# Patient Record
Sex: Female | Born: 1985 | Race: Black or African American | Hispanic: No | Marital: Single | Smoking: Current every day smoker
Health system: Southern US, Community
[De-identification: ages and names within clinical notes are randomized; demographics above are authoritative.]

## PROBLEM LIST (undated history)

## (undated) HISTORY — PX: NO PAST SURGERIES: SHX2092

---

## 2006-06-23 ENCOUNTER — Inpatient Hospital Stay (HOSPITAL_COMMUNITY): Admission: RE | Admit: 2006-06-23 | Discharge: 2006-06-25 | Payer: Self-pay | Admitting: Obstetrics

## 2010-04-13 ENCOUNTER — Emergency Department (HOSPITAL_COMMUNITY): Admission: EM | Admit: 2010-04-13 | Discharge: 2010-04-13 | Payer: Self-pay | Admitting: Emergency Medicine

## 2010-09-28 NOTE — L&D Delivery Note (Signed)
Delivery Note At 4:57 PM a viable female was delivered via Vaginal, Spontaneous Delivery (Presentation: Left Occiput Anterior).  APGAR: , ; weight .   Placenta status: , Spontaneous.  Cord: 3 vessels with the following complications: None.  Cord pH: not done Anesthesia: Epidural  Episiotomy: None Lacerations: None Suture Repair: 2.0 Est. Blood Loss (mL): 300  Mom to postpartum.  Baby to nursery-stable.  Ronit Marczak A 05/01/2011, 5:11 PM

## 2010-12-10 ENCOUNTER — Other Ambulatory Visit (HOSPITAL_COMMUNITY): Payer: Self-pay | Admitting: Obstetrics

## 2010-12-10 DIAGNOSIS — Z3689 Encounter for other specified antenatal screening: Secondary | ICD-10-CM

## 2010-12-11 ENCOUNTER — Ambulatory Visit (HOSPITAL_COMMUNITY)
Admission: RE | Admit: 2010-12-11 | Discharge: 2010-12-11 | Disposition: A | Discharge status: Home / Self Care | Payer: Medicaid Other | Source: Ambulatory Visit | Attending: Obstetrics | Admitting: Obstetrics

## 2010-12-11 DIAGNOSIS — Z1389 Encounter for screening for other disorder: Secondary | ICD-10-CM | POA: Insufficient documentation

## 2010-12-11 DIAGNOSIS — Z363 Encounter for antenatal screening for malformations: Secondary | ICD-10-CM | POA: Insufficient documentation

## 2010-12-11 DIAGNOSIS — O358XX0 Maternal care for other (suspected) fetal abnormality and damage, not applicable or unspecified: Secondary | ICD-10-CM | POA: Insufficient documentation

## 2010-12-11 DIAGNOSIS — Z3689 Encounter for other specified antenatal screening: Secondary | ICD-10-CM

## 2010-12-16 ENCOUNTER — Other Ambulatory Visit (HOSPITAL_COMMUNITY): Payer: Self-pay | Admitting: Obstetrics

## 2010-12-16 DIAGNOSIS — Z3689 Encounter for other specified antenatal screening: Secondary | ICD-10-CM

## 2011-01-01 ENCOUNTER — Ambulatory Visit (HOSPITAL_COMMUNITY): Payer: Medicaid Other

## 2011-01-02 ENCOUNTER — Ambulatory Visit (HOSPITAL_COMMUNITY)
Admission: RE | Admit: 2011-01-02 | Discharge: 2011-01-02 | Disposition: A | Discharge status: Home / Self Care | Payer: Medicaid Other | Source: Ambulatory Visit | Attending: Obstetrics | Admitting: Obstetrics

## 2011-01-02 ENCOUNTER — Encounter (HOSPITAL_COMMUNITY): Payer: Self-pay

## 2011-01-02 DIAGNOSIS — Z3689 Encounter for other specified antenatal screening: Secondary | ICD-10-CM | POA: Insufficient documentation

## 2011-01-02 DIAGNOSIS — O26849 Uterine size-date discrepancy, unspecified trimester: Secondary | ICD-10-CM | POA: Insufficient documentation

## 2011-04-30 ENCOUNTER — Inpatient Hospital Stay (HOSPITAL_COMMUNITY): Admission: RE | Admit: 2011-04-30 | Payer: Medicaid Other | Source: Ambulatory Visit

## 2011-05-01 ENCOUNTER — Inpatient Hospital Stay (HOSPITAL_COMMUNITY): Payer: 59 | Admitting: Anesthesiology

## 2011-05-01 ENCOUNTER — Encounter (HOSPITAL_COMMUNITY): Payer: Self-pay | Admitting: Obstetrics

## 2011-05-01 ENCOUNTER — Inpatient Hospital Stay (HOSPITAL_COMMUNITY)
Admission: AD | Admit: 2011-05-01 | Discharge: 2011-05-03 | DRG: 775 | Disposition: A | Discharge status: Home / Self Care | Payer: 59 | Source: Ambulatory Visit | Attending: Obstetrics | Admitting: Obstetrics

## 2011-05-01 ENCOUNTER — Encounter (HOSPITAL_COMMUNITY): Payer: Self-pay

## 2011-05-01 ENCOUNTER — Encounter (HOSPITAL_COMMUNITY): Payer: Self-pay | Admitting: Anesthesiology

## 2011-05-01 LAB — RPR
RPR Ser Ql: NONREACTIVE
RPR: NONREACTIVE

## 2011-05-01 LAB — ABO/RH: RH Type: POSITIVE

## 2011-05-01 LAB — CBC
HCT: 37.1 % (ref 36.0–46.0)
Hemoglobin: 12 g/dL (ref 12.0–15.0)
RDW: 15.3 % (ref 11.5–15.5)
WBC: 9 10*3/uL (ref 4.0–10.5)

## 2011-05-01 LAB — STREP B DNA PROBE: GBS: NEGATIVE

## 2011-05-01 LAB — HIV ANTIBODY (ROUTINE TESTING W REFLEX): HIV: NONREACTIVE

## 2011-05-01 LAB — RUBELLA ANTIBODY, IGM: Rubella: IMMUNE

## 2011-05-01 MED ORDER — OXYCODONE-ACETAMINOPHEN 5-325 MG PO TABS: 1.0000 | ORAL_TABLET | ORAL | Status: DC | PRN

## 2011-05-01 MED ORDER — EPHEDRINE 5 MG/ML INJ: 10.0000 mg | INTRAVENOUS | Status: DC | PRN

## 2011-05-01 MED ORDER — ZOLPIDEM TARTRATE 5 MG PO TABS: 5.0000 mg | ORAL_TABLET | Freq: Every evening | ORAL | Status: DC | PRN

## 2011-05-01 MED ORDER — OXYCODONE-ACETAMINOPHEN 5-325 MG PO TABS: 2.0000 | ORAL_TABLET | ORAL | Status: DC | PRN

## 2011-05-01 MED ORDER — PRENATAL PLUS 27-1 MG PO TABS
1.0000 | ORAL_TABLET | Freq: Every day | ORAL | Status: DC
  Administered 2011-05-02 – 2011-05-03 (×2): 1 via ORAL
  Filled 2011-05-01 (×2): qty 1

## 2011-05-01 MED ORDER — SENNOSIDES-DOCUSATE SODIUM 8.6-50 MG PO TABS
2.0000 | ORAL_TABLET | Freq: Every day | ORAL | Status: DC
  Administered 2011-05-01: 1 via ORAL
  Administered 2011-05-02: 2 via ORAL

## 2011-05-01 MED ORDER — FLEET ENEMA 7-19 GM/118ML RE ENEM: 1.0000 | ENEMA | RECTAL | Status: DC | PRN

## 2011-05-01 MED ORDER — SODIUM CHLORIDE 0.9 % IJ SOLN: 3.0000 mL | Freq: Two times a day (BID) | INTRAMUSCULAR | Status: DC

## 2011-05-01 MED ORDER — EPHEDRINE 5 MG/ML INJ
10.0000 mg | INTRAVENOUS | Status: DC | PRN
  Filled 2011-05-01: qty 4

## 2011-05-01 MED ORDER — ONDANSETRON HCL 4 MG/2ML IJ SOLN: 4.0000 mg | Freq: Four times a day (QID) | INTRAMUSCULAR | Status: DC | PRN

## 2011-05-01 MED ORDER — FENTANYL 2.5 MCG/ML BUPIVACAINE 1/10 % EPIDURAL INFUSION (WH - ANES)
14.0000 mL/h | INTRAMUSCULAR | Status: DC
  Administered 2011-05-01: 14 mL/h via EPIDURAL
  Filled 2011-05-01: qty 60

## 2011-05-01 MED ORDER — SIMETHICONE 80 MG PO CHEW: 80.0000 mg | CHEWABLE_TABLET | ORAL | Status: DC | PRN

## 2011-05-01 MED ORDER — LACTATED RINGERS IV SOLN
500.0000 mL | Freq: Once | INTRAVENOUS | Status: AC
  Administered 2011-05-01: 500 mL via INTRAVENOUS

## 2011-05-01 MED ORDER — OXYTOCIN 20 UNITS IN LACTATED RINGERS INFUSION - SIMPLE
125.0000 mL/h | Freq: Once | INTRAVENOUS | Status: AC
  Administered 2011-05-01: 125 mL/h via INTRAVENOUS
  Filled 2011-05-01: qty 1000

## 2011-05-01 MED ORDER — CITRIC ACID-SODIUM CITRATE 334-500 MG/5ML PO SOLN
30.0000 mL | ORAL | Status: DC | PRN
  Administered 2011-05-01: 30 mL via ORAL
  Filled 2011-05-01: qty 15

## 2011-05-01 MED ORDER — LACTATED RINGERS IV SOLN: 500.0000 mL | INTRAVENOUS | Status: DC | PRN

## 2011-05-01 MED ORDER — ONDANSETRON HCL 4 MG/2ML IJ SOLN: 4.0000 mg | INTRAMUSCULAR | Status: DC | PRN

## 2011-05-01 MED ORDER — PHENYLEPHRINE 40 MCG/ML (10ML) SYRINGE FOR IV PUSH (FOR BLOOD PRESSURE SUPPORT): 80.0000 ug | PREFILLED_SYRINGE | INTRAVENOUS | Status: DC | PRN

## 2011-05-01 MED ORDER — DIPHENHYDRAMINE HCL 50 MG/ML IJ SOLN: 12.5000 mg | INTRAMUSCULAR | Status: DC | PRN

## 2011-05-01 MED ORDER — ACETAMINOPHEN 325 MG PO TABS: 650.0000 mg | ORAL_TABLET | ORAL | Status: DC | PRN

## 2011-05-01 MED ORDER — SODIUM CHLORIDE 0.9 % IV SOLN: 250.0000 mL | INTRAVENOUS | Status: DC

## 2011-05-01 MED ORDER — NALBUPHINE SYRINGE 5 MG/0.5 ML: 10.0000 mg | INJECTION | INTRAMUSCULAR | Status: DC | PRN

## 2011-05-01 MED ORDER — OXYTOCIN 20 UNITS IN LACTATED RINGERS INFUSION - SIMPLE: 125.0000 mL/h | INTRAVENOUS | Status: DC | PRN

## 2011-05-01 MED ORDER — LACTATED RINGERS IV SOLN
INTRAVENOUS | Status: DC
  Administered 2011-05-01: 125 mL/h via INTRAVENOUS

## 2011-05-01 MED ORDER — ONDANSETRON HCL 4 MG PO TABS: 4.0000 mg | ORAL_TABLET | ORAL | Status: DC | PRN

## 2011-05-01 MED ORDER — PHENYLEPHRINE 40 MCG/ML (10ML) SYRINGE FOR IV PUSH (FOR BLOOD PRESSURE SUPPORT)
80.0000 ug | PREFILLED_SYRINGE | INTRAVENOUS | Status: DC | PRN
  Filled 2011-05-01: qty 5

## 2011-05-01 MED ORDER — DIPHENHYDRAMINE HCL 25 MG PO CAPS: 25.0000 mg | ORAL_CAPSULE | Freq: Four times a day (QID) | ORAL | Status: DC | PRN

## 2011-05-01 MED ORDER — IBUPROFEN 600 MG PO TABS
600.0000 mg | ORAL_TABLET | Freq: Four times a day (QID) | ORAL | Status: DC
  Administered 2011-05-02 – 2011-05-03 (×6): 600 mg via ORAL
  Filled 2011-05-01 (×5): qty 1

## 2011-05-01 MED ORDER — SODIUM CHLORIDE 0.9 % IJ SOLN: 3.0000 mL | INTRAMUSCULAR | Status: DC | PRN

## 2011-05-01 MED ORDER — TETANUS-DIPHTH-ACELL PERTUSSIS 5-2.5-18.5 LF-MCG/0.5 IM SUSP
0.5000 mL | Freq: Once | INTRAMUSCULAR | Status: AC
  Administered 2011-05-02: 0.5 mL via INTRAMUSCULAR
  Filled 2011-05-01: qty 0.5

## 2011-05-01 MED ORDER — LIDOCAINE HCL (PF) 1 % IJ SOLN
30.0000 mL | INTRAMUSCULAR | Status: DC | PRN
  Filled 2011-05-01: qty 30

## 2011-05-01 MED ORDER — BENZOCAINE-MENTHOL 20-0.5 % EX AERO: 1.0000 "application " | INHALATION_SPRAY | CUTANEOUS | Status: DC | PRN

## 2011-05-01 MED ORDER — LIDOCAINE HCL 1.5 % IJ SOLN
INTRAMUSCULAR | Status: DC | PRN
  Administered 2011-05-01 (×2): 4 mL

## 2011-05-01 MED ORDER — FERROUS SULFATE 325 (65 FE) MG PO TABS
325.0000 mg | ORAL_TABLET | Freq: Two times a day (BID) | ORAL | Status: DC
  Administered 2011-05-02 (×2): 325 mg via ORAL
  Filled 2011-05-01 (×2): qty 1

## 2011-05-01 MED ORDER — IBUPROFEN 600 MG PO TABS
600.0000 mg | ORAL_TABLET | Freq: Four times a day (QID) | ORAL | Status: DC | PRN
  Administered 2011-05-01: 600 mg via ORAL
  Filled 2011-05-01 (×2): qty 1

## 2011-05-01 NOTE — H&P (Signed)
This is Dr. Francoise Ceo dictating the admitting note on Nicole Wall The patient's a 25 year old gravida 3 para 1011 EDC 05/01/2011 No known allergies which is B+ she was admitted in labor cervix 6 cm dilated bulging membranes vertex 0 station Negative GBS membranes were ruptured artificially the fluid was clear and shows a fully dilated at 0 station Past medical history negative Social history negative Family history negative System review negative Physical HEENT negative Breasts negative Heart regular rhythm no murmurs no gallops Lungs clear Abdomen term Pelvic as described above Extremities negative

## 2011-05-01 NOTE — Anesthesia Procedure Notes (Signed)
Epidural Patient location during procedure: OB Start time: 05/01/2011 4:19 PM  Staffing Anesthesiologist: Mahmud Keithly A. Performed by: anesthesiologist   Preanesthetic Checklist Completed: patient identified, site marked, surgical consent, pre-op evaluation, timeout performed, IV checked, risks and benefits discussed and monitors and equipment checked  Epidural Patient position: sitting Prep: site prepped and draped and DuraPrep Patient monitoring: continuous pulse ox and blood pressure Approach: midline Injection technique: LOR air  Needle:  Needle type: Tuohy  Needle gauge: 17 G Needle length: 9 cm Needle insertion depth: 5 cm cm Catheter type: closed end flexible Catheter size: 19 Gauge Catheter at skin depth: 10 cm Test dose: negative and 1.5% lidocaine  Assessment Events: blood not aspirated, injection not painful, no injection resistance, negative IV test and no paresthesia  Additional Notes Patient is more comfortable after epidural dosed. Please see RN's note for documentation of vital signs and FHR which are stable.

## 2011-05-01 NOTE — Progress Notes (Signed)
Pt states she is having frequent contractions, no leaking or bleeding with good fetal movement. Pt was 4 cm in the office yesterday and is scheduled for IOL on 8-6.

## 2011-05-01 NOTE — Anesthesia Preprocedure Evaluation (Signed)
Anesthesia Evaluation  Name, MR# and DOB Patient awake  General Assessment Comment  Reviewed: Allergy & Precautions, H&P  and Patient's Chart, lab work & pertinent test results  Airway Mallampati: III TM Distance: >3 FB Neck ROM: full    Dental No notable dental hx (+) Teeth Intact   Pulmonaryneg pulmonary ROS    clear to auscultation  pulmonary exam normal   Cardiovascular regular Normal   Neuro/PsychNegative Neurological ROS Negative Psych ROS  GI/Hepatic/Renal negative GI ROS, negative Liver ROS, and negative Renal ROS (+)       Endo/Other  Negative Endocrine ROS (+)   Abdominal   Musculoskeletal  Hematology negative hematology ROS (+)   Peds  Reproductive/Obstetrics (+) Pregnancy   Anesthesia Other Findings             Anesthesia Physical Anesthesia Plan  ASA: II  Anesthesia Plan: Epidural   Post-op Pain Management:    Induction:   Airway Management Planned:   Additional Equipment:   Intra-op Plan:   Post-operative Plan:   Informed Consent: I have reviewed the patients History and Physical, chart, labs and discussed the procedure including the risks, benefits and alternatives for the proposed anesthesia with the patient or authorized representative who has indicated his/her understanding and acceptance.     Plan Discussed with: Anesthesiologist  Anesthesia Plan Comments:         Anesthesia Quick Evaluation

## 2011-05-02 ENCOUNTER — Encounter (HOSPITAL_COMMUNITY): Payer: Self-pay

## 2011-05-02 LAB — CBC
HCT: 32.5 % — ABNORMAL LOW (ref 36.0–46.0)
MCH: 25.7 pg — ABNORMAL LOW (ref 26.0–34.0)
MCHC: 32 g/dL (ref 30.0–36.0)
MCV: 80.2 fL (ref 78.0–100.0)
Platelets: 204 10*3/uL (ref 150–400)
RDW: 15.4 % (ref 11.5–15.5)
WBC: 14.4 10*3/uL — ABNORMAL HIGH (ref 4.0–10.5)

## 2011-05-02 NOTE — Anesthesia Postprocedure Evaluation (Signed)
  Anesthesia Post-op Note  Patient: Nicole Wall  Procedure(s) Performed: * No surgery found *  Patient Location: PACU and Mother/Baby  Anesthesia Type: Epidural  Level of Consciousness: awake, oriented and patient cooperative  Airway and Oxygen Therapy: Patient Spontanous Breathing  Post-op Pain: none  Post-op Assessment: Post-op Vital signs reviewed and No signs of Nausea or vomiting  Post-op Vital Signs: Reviewed and stable  Complications: No apparent anesthesia complications

## 2011-05-02 NOTE — Progress Notes (Signed)
Postpartum day one Vital signs normal Fundus firm Lochia moderate Legs negative No complaints    Postpartum day one Vital signs normal Fundus firm Lochia moderate Legs negative No complaints

## 2011-05-02 NOTE — Anesthesia Postprocedure Evaluation (Signed)
  Anesthesia Post-op Note  Patient: Nicole Wall  Procedure(s) Performed: * Lumbar Epidural for L & D *  Patient Location: Labor and Delivery  Anesthesia Type: Epidural  Level of Consciousness: awake, alert  and oriented  Airway and Oxygen Therapy: Patient Spontanous Breathing  Post-op Pain: none  Post-op Assessment: Post-op Vital signs reviewed, Patient's Cardiovascular Status Stable, Respiratory Function Stable, Patent Airway, No signs of Nausea or vomiting, Pain level controlled, No headache, No backache, No residual numbness and No residual motor weakness  Post-op Vital Signs: Reviewed and stable  Complications: No apparent anesthesia complications

## 2011-05-03 MED ORDER — OXYCODONE-ACETAMINOPHEN 5-325 MG PO TABS: 1.0000 | ORAL_TABLET | ORAL | Status: AC | PRN

## 2011-05-03 NOTE — Progress Notes (Signed)
Mom has been giving some formula by bottle. States she has blisters on her nipples. Small blisters on both nipples noted. Baby had just fed 1 hour ago. Reviewed proper latch- getting the baby deep on the breast. No questions at present.

## 2011-05-03 NOTE — Discharge Summary (Signed)
Obstetric Discharge Summary Reason for Admission: onset of labor Prenatal Procedures: none Intrapartum Procedures: spontaneous vaginal delivery Postpartum Procedures: none Complications-Operative and Postpartum: none Hemoglobin  Date Value Range Status  05/02/2011 10.4* 12.0-15.0 (g/dL) Final     HCT  Date Value Range Status  05/02/2011 32.5* 36.0-46.0 (%) Final    Discharge Diagnoses: Term Pregnancy-delivered  Discharge Information: Date: 05/03/2011 Activity: pelvic rest Diet: routine Medications: Percocet Condition: stable Instructions: refer to practice specific booklet Discharge to: home Follow-up Information    Follow up with Keen Ewalt A, MD. Call in 6 weeks.   Contact information:   [address] [place] [address] [phone]          Newborn Data: Live born female  Birth Weight: 7 lb 4.8 oz (3311 g) APGAR: 8, 9  Home with mother.  Billee Balcerzak A 05/03/2011, 7:20 AM

## 2011-05-03 NOTE — Progress Notes (Signed)
  Postpartum day 2  Fundus firm Lochia moderate Legs negative Home today on Percocet

## 2011-05-04 ENCOUNTER — Inpatient Hospital Stay (HOSPITAL_COMMUNITY): Admission: RE | Admit: 2011-05-04 | Payer: Medicaid Other | Source: Ambulatory Visit

## 2011-05-04 NOTE — Progress Notes (Signed)
UR chart review completed.  

## 2011-05-26 ENCOUNTER — Emergency Department (HOSPITAL_COMMUNITY)
Admission: EM | Admit: 2011-05-26 | Discharge: 2011-05-27 | Disposition: A | Discharge status: Home / Self Care | Payer: 59 | Attending: Emergency Medicine | Admitting: Emergency Medicine

## 2011-05-26 DIAGNOSIS — K5900 Constipation, unspecified: Secondary | ICD-10-CM | POA: Insufficient documentation

## 2012-07-09 IMAGING — US US OB DETAIL+14 WK
1 series · 12 of 28 positions shown · non-contrast
Comparison: none

[Series 1: us ob detail +14 wk · 104 acquisitions, 12 frames shown]
[im 4/104]
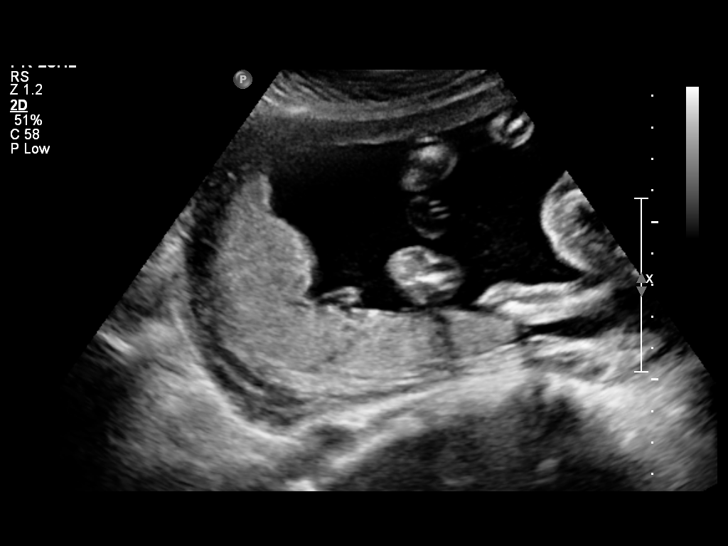
[im 12/104]
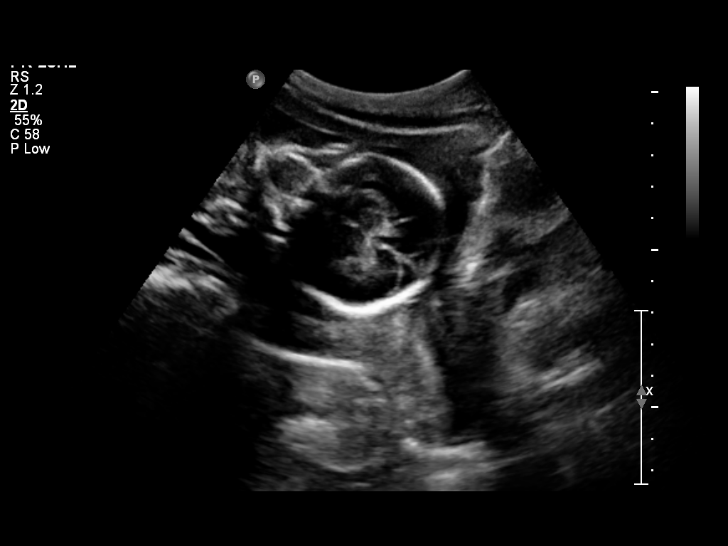
[im 20/104]
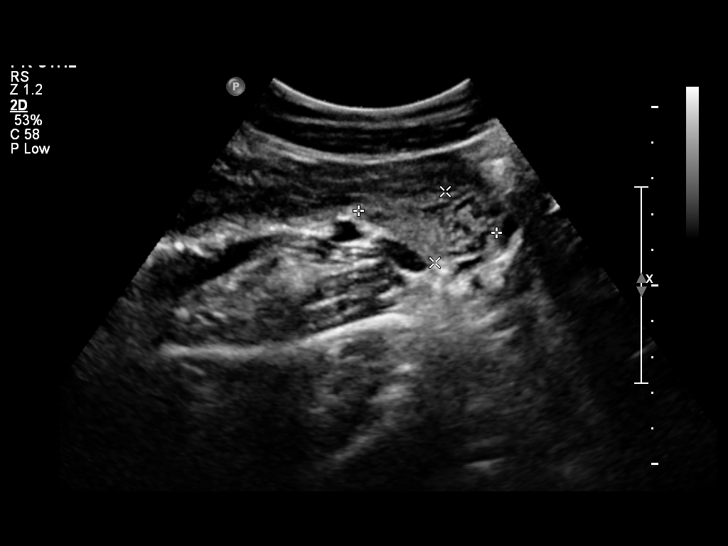
[im 31/104]
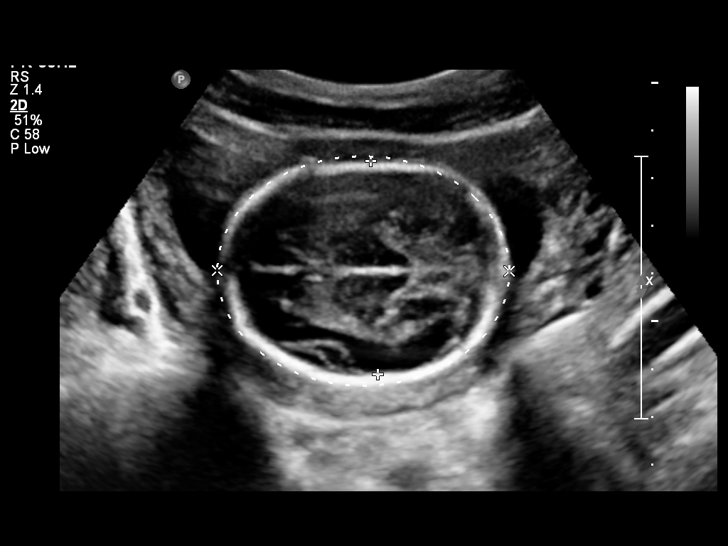
[im 39/104]
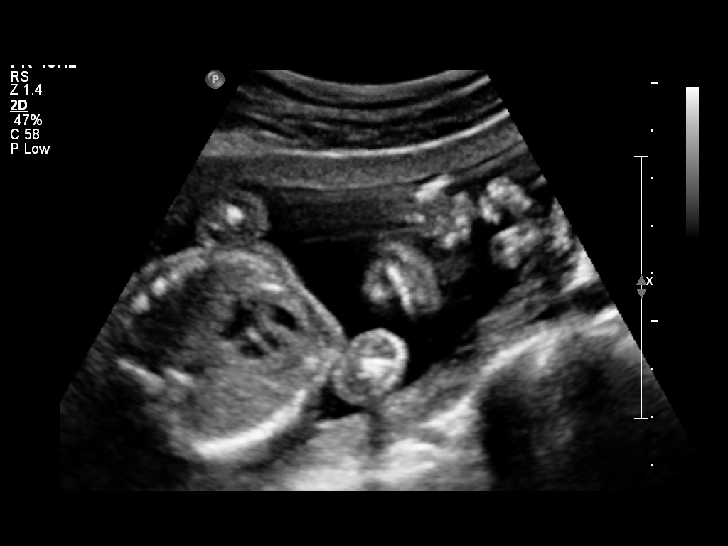
[im 46/104]
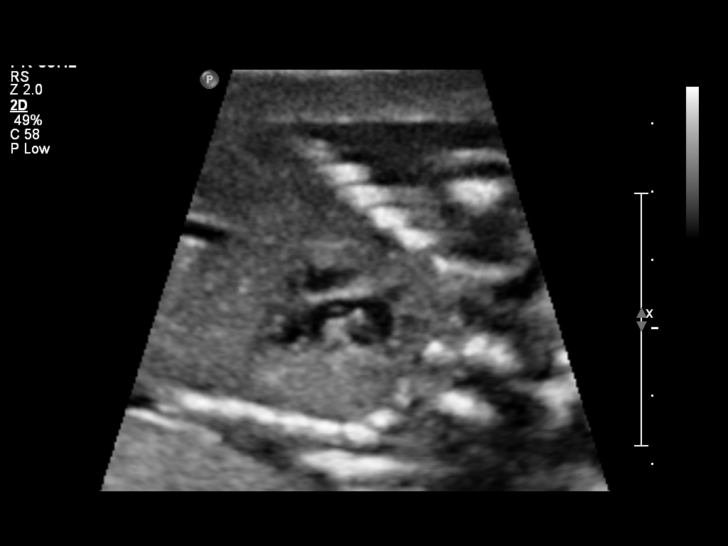
[im 58/104]
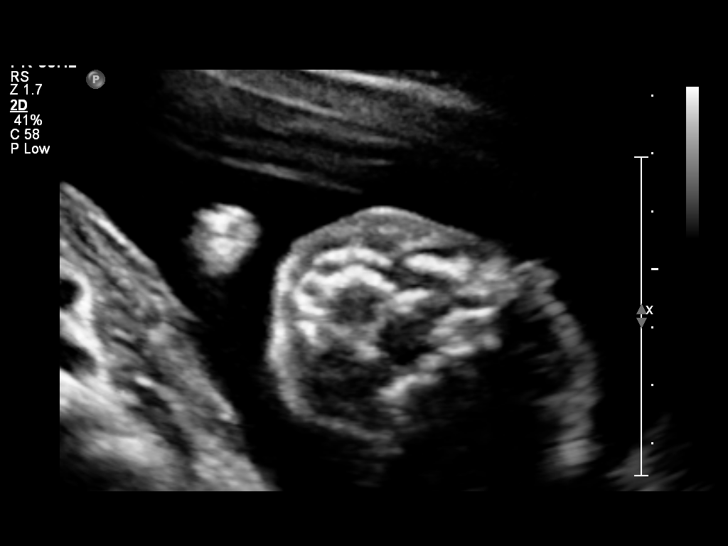
[im 65/104]
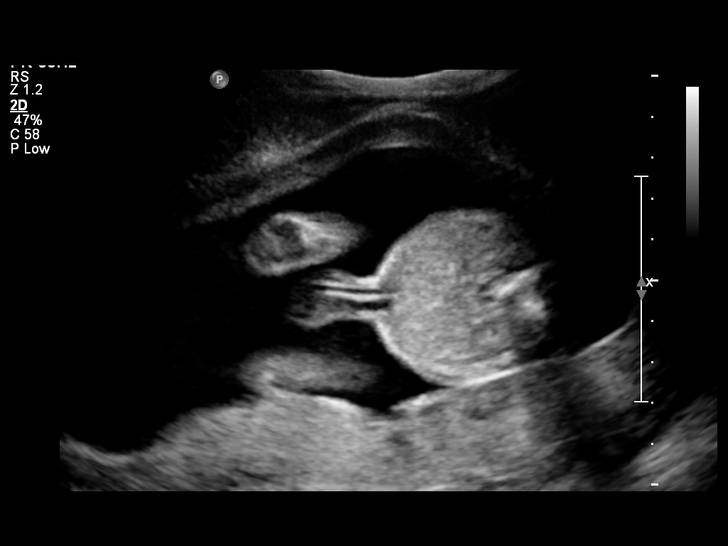
[im 73/104]
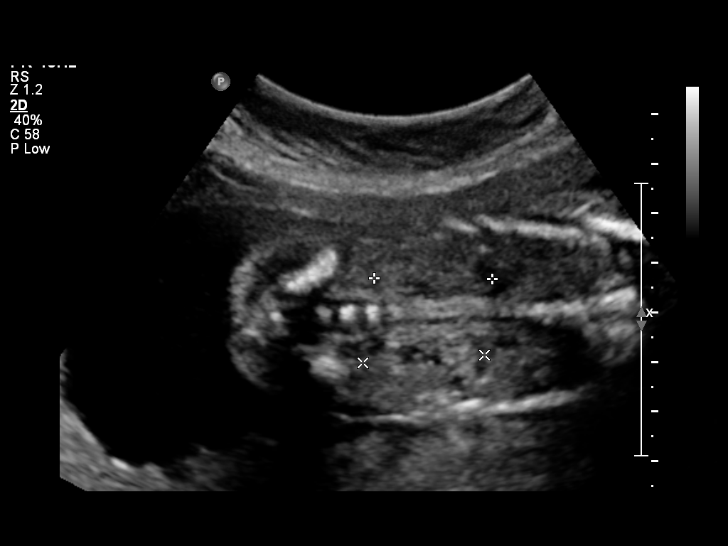
[im 84/104]
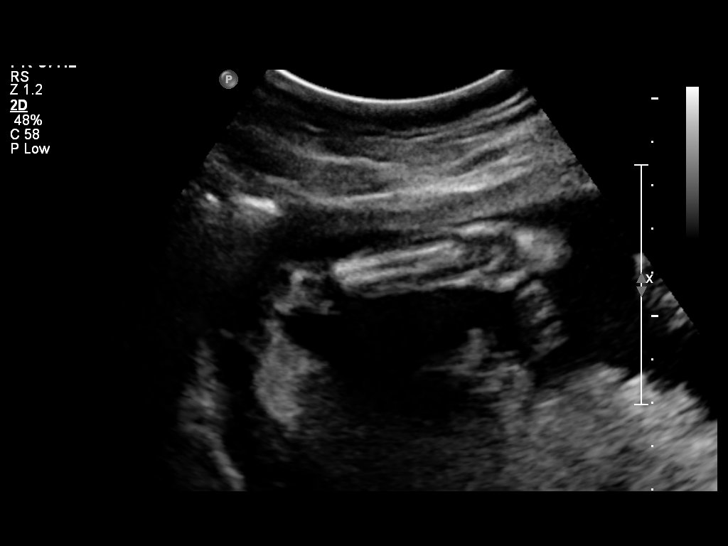
[im 92/104]
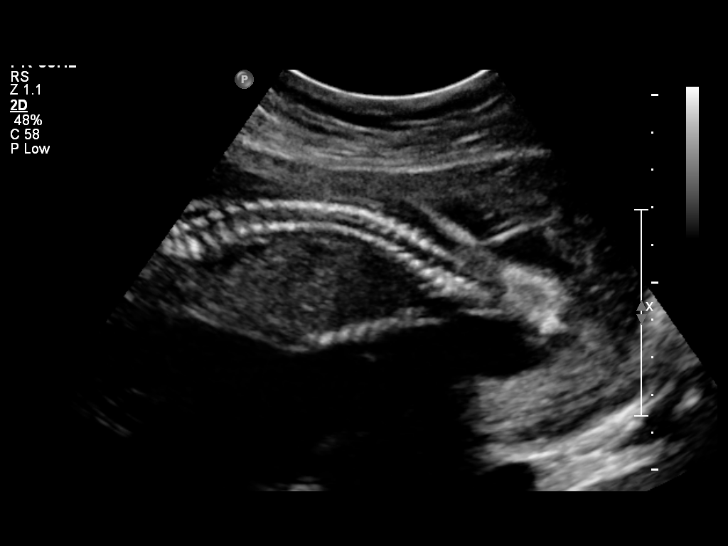
[im 100/104]
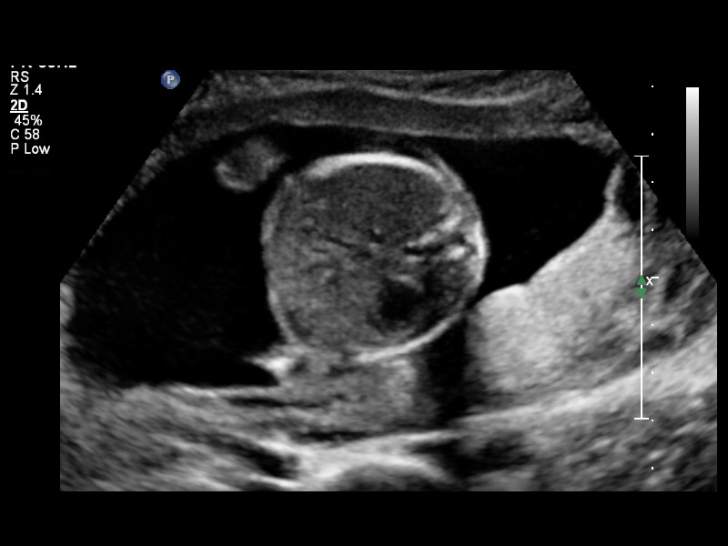

[12 of 28 positions shown; findings below may reference images not displayed]

OBSTETRICS REPORT
                      (Signed Final 12/11/2010 [DATE])

 Order#:         [id number]
Procedures

 US OB DETAIL + 14 WK                                  76811.0
Indications

 Detailed fetal anatomic survey
Fetal Evaluation

 Fetal Heart Rate:  140                          bpm
 Cardiac Activity:  Observed
 Presentation:      Cephalic
 Placenta:          Posterior, above cervical
                    os
 P. Cord            Visualized
 Insertion:

 Amniotic Fluid
 AFI FV:      Subjectively within normal limits
                                             Larg Pckt:     5.9  cm
Biometry

 BPD:     44.6  mm     G. Age:  19w 3d                CI:        68.13   70 - 86
                                                      FL/HC:      18.8   16.8 -

 HC:     172.8  mm     G. Age:  19w 6d       42  %    HC/AC:      1.15   1.09 -

 AC:       150  mm     G. Age:  20w 1d       57  %    FL/BPD:
 FL:      32.4  mm     G. Age:  20w 1d       51  %    FL/AC:      21.6   20 - 24
 HUM:     30.2  mm     G. Age:  20w 0d       55  %
 CER:     19.8  mm     G. Age:  18w 6d       28  %

 Est. FW:     334  gm    0 lb 12 oz      53  %
Gestational Age

 LMP:           21w 5d        Date:  07/12/10                 EDD:   04/18/11
 U/S Today:     19w 6d                                        EDD:   05/01/11
 Best:          19w 6d     Det. By:  U/S (12/11/10)           EDD:   05/01/11
Anatomy
 Cranium:           Appears normal      Aortic Arch:       Appears normal
 Fetal Cavum:       Appears normal      Ductal Arch:       Appears normal
 Ventricles:        Appears normal      Diaphragm:         Appears normal
 Choroid Plexus:    Appears normal      Stomach:           Appears
                                                           normal, left
                                                           sided
 Cerebellum:        Appears normal      Abdomen:           Appears normal
 Posterior Fossa:   Appears normal      Abdominal Wall:    Appears nml
                                                           (cord insert,
                                                           abd wall)
 Nuchal Fold:       Appears normal      Cord Vessels:      Appears normal
                    (neck, nuchal                          (3 vessel cord)
                    fold)
 Face:              Appears normal      Kidneys:           Appear normal
                    (lips/profile/orbit
                    s)
 Heart:             Appears normal      Bladder:           Appears normal
                    (4 chamber &
                    axis)
 RVOT:              Appears normal      Spine:             Appears normal
 LVOT:              Appears normal      Limbs:             Appears normal
                                                           (hands, ankles,
                                                           feet)

 Other:     Fetus appears to be a female. Heels and 5th digit
            visualized. Nasal bone visualized.
Cervix Uterus Adnexa

 Cervical Length:    3.5      cm

 Cervix:       Normal appearance by transabdominal scan.
 Left Ovary:    Within normal limits.
 Right Ovary:   Within normal limits.
 Adnexa:     No abnormality visualized.
Impression

 Siup demonstrating an EGA by ultrasound of 19w 6d. This is
 13 days behind expected EGA by LMP of 21w 5d and
 suggests best dating by today's exam. Follow up to assess
 for appropriate linear growth is recommended in 3 weeks
 given today's discrepancy.

 No focal fetal or placental abnormalities are noted with a
 good anatomic evaluation possible. No soft markers for Down
 Syndrome are seen. Given the expected age at delivery of
 25, today's normal ultrasound would decrease the age related
 risk for Down Syndrome from [DATE] to [DATE] (Conrrado et al).
 Correlation with other aneuploidy screening results, if
 available, would be recommended for a more complete risk
 assessment.

 Subjectively and quantitatively normal amniotic fluid volume.
 Normal cervical length.
Recommendations

 Follow-up in 3 weeks is recommended to assess for
 appropriate linear growth given today's findings.

## 2014-07-30 ENCOUNTER — Encounter (HOSPITAL_COMMUNITY): Payer: Self-pay | Admitting: Obstetrics

## 2019-08-12 ENCOUNTER — Ambulatory Visit (HOSPITAL_COMMUNITY)
Admission: EM | Admit: 2019-08-12 | Discharge: 2019-08-12 | Disposition: A | Discharge status: Home / Self Care | Payer: 59 | Attending: Family Medicine | Admitting: Family Medicine

## 2019-08-12 ENCOUNTER — Encounter (HOSPITAL_COMMUNITY): Payer: Self-pay

## 2019-08-12 ENCOUNTER — Other Ambulatory Visit: Payer: Self-pay

## 2019-08-12 DIAGNOSIS — Z3202 Encounter for pregnancy test, result negative: Secondary | ICD-10-CM

## 2019-08-12 DIAGNOSIS — U071 COVID-19: Secondary | ICD-10-CM | POA: Diagnosis not present

## 2019-08-12 DIAGNOSIS — R42 Dizziness and giddiness: Secondary | ICD-10-CM

## 2019-08-12 DIAGNOSIS — J45909 Unspecified asthma, uncomplicated: Secondary | ICD-10-CM | POA: Insufficient documentation

## 2019-08-12 DIAGNOSIS — F17210 Nicotine dependence, cigarettes, uncomplicated: Secondary | ICD-10-CM | POA: Insufficient documentation

## 2019-08-12 DIAGNOSIS — R110 Nausea: Secondary | ICD-10-CM | POA: Diagnosis not present

## 2019-08-12 DIAGNOSIS — R6883 Chills (without fever): Secondary | ICD-10-CM

## 2019-08-12 LAB — CBC
HCT: 43.4 % (ref 36.0–46.0)
Hemoglobin: 14.2 g/dL (ref 12.0–15.0)
MCH: 27.3 pg (ref 26.0–34.0)
MCHC: 32.7 g/dL (ref 30.0–36.0)
MCV: 83.5 fL (ref 80.0–100.0)
Platelets: 231 10*3/uL (ref 150–400)
RBC: 5.2 MIL/uL — ABNORMAL HIGH (ref 3.87–5.11)
RDW: 13.2 % (ref 11.5–15.5)
WBC: 4 10*3/uL (ref 4.0–10.5)
nRBC: 0 % (ref 0.0–0.2)

## 2019-08-12 LAB — COMPREHENSIVE METABOLIC PANEL
ALT: 29 U/L (ref 0–44)
AST: 23 U/L (ref 15–41)
Albumin: 4.1 g/dL (ref 3.5–5.0)
Alkaline Phosphatase: 34 U/L — ABNORMAL LOW (ref 38–126)
Anion gap: 11 (ref 5–15)
BUN: 11 mg/dL (ref 6–20)
CO2: 23 mmol/L (ref 22–32)
Calcium: 9.4 mg/dL (ref 8.9–10.3)
Chloride: 105 mmol/L (ref 98–111)
Creatinine, Ser: 0.61 mg/dL (ref 0.44–1.00)
GFR calc Af Amer: >60 mL/min (ref >60)
GFR calc non Af Amer: >60 mL/min (ref >60)
Glucose, Bld: 84 mg/dL (ref 70–99)
Potassium: 4.2 mmol/L (ref 3.5–5.1)
Sodium: 139 mmol/L (ref 135–145)
Total Bilirubin: 0.4 mg/dL (ref 0.3–1.2)
Total Protein: 8.3 g/dL — ABNORMAL HIGH (ref 6.5–8.1)

## 2019-08-12 LAB — POCT PREGNANCY, URINE: Preg Test, Ur: NEGATIVE

## 2019-08-12 LAB — GLUCOSE, CAPILLARY: Glucose-Capillary: 82 mg/dL (ref 70–99)

## 2019-08-12 NOTE — Discharge Instructions (Addendum)
Your EKG was normal.  Your blood sugar is normal Your pregnancy test was negative We drew some more blood work and we will call you with any abnormal results this evening. This could be some sort of viral illness.  We will call you if your Covid results are positive.  I would stay home and rest and take precautions Drink plenty of fluids If the symptoms become worse you might need to go to the ER.

## 2019-08-12 NOTE — ED Provider Notes (Signed)
[facility]    CSN: [id number] Arrival date & time: 08/12/19  1330      History   Chief Complaint Chief Complaint  Patient presents with  . Dizziness    HPI Nicole RileyDanielle Wall is a 33 y.o. female.   Patient is a 33 year old female with past medical history of asthma.  She presents today with approximate 4 days of lightheadedness, nausea.  Reporting for the first 2 days she is having body aches, chills, nausea with the lightheadedness.  Felt better yesterday went to work and woke up this morning feeling the same way.  Nausea without vomiting.  No associated fever, cough, chest congestion, ear pain, sore throat, nasal congestion, rhinorrhea, diarrhea.  No chest pain or shortness of breath.  Denies any history of DVT or PE.  No headache, blurred vision, weakness in extremities, slurred speech.  Patient currently has IUD which is expired.  Unknown of last menstrual cycle.  ROS per HPI        Past Medical History:  Diagnosis Date  . Asthma   . NVD (normal vaginal delivery) 05/01/2011    Patient Active Problem List   Diagnosis Date Noted  . NVD (normal vaginal delivery) 05/01/2011    Past Surgical History:  Procedure Laterality Date  . NO PAST SURGERIES      OB History    Gravida  3   Para  2   Term  2   Preterm  0   AB  1   Living  2     SAB  0   TAB  1   Ectopic  0   Multiple  0   Live Births  1            Home Medications    Prior to Admission medications   Not on File    Family History History reviewed. No pertinent family history.  Social History Social History   Tobacco Use  . Smoking status: Current Every Day Smoker    Packs/day: 0.50    Types: Cigarettes  . Smokeless tobacco: Never Used  Substance Use Topics  . Alcohol use: Yes  . Drug use: No     Allergies   Patient has no known allergies.   Review of Systems Review of Systems   Physical Exam Triage Vital Signs ED Triage Vitals  Enc Vitals Group     BP 08/12/19 1427 131/83     Pulse Rate 08/12/19 1427 66     Resp 08/12/19 1427 18     Temp 08/12/19 1427 98.4 F (36.9 C)     Temp Source 08/12/19 1427 Other     SpO2 08/12/19 1427 100 %     Weight 08/12/19 1429 174 lb (78.9 kg)     Height --      Head Circumference --      Peak Flow --      Pain Score 08/12/19 1429 0     Pain Loc --      Pain Edu? --      Excl. in GC? --    No data found.  Updated Vital Signs BP 131/83 (BP Location: Right Arm)   Pulse 66   Temp 98.4 F (36.9 C) (Other (Comment))   Resp 18   Wt 174 lb (78.9 kg)   SpO2 100%   BMI 30.82 kg/m   Visual Acuity Right Eye Distance:   Left Eye Distance:   Bilateral Distance:    Right Eye Near:  Left Eye Near:    Bilateral Near:     Physical Exam Vitals signs and nursing note reviewed.  Constitutional:      General: She is not in acute distress.    Appearance: Normal appearance. She is well-developed. She is not ill-appearing, toxic-appearing or diaphoretic.  HENT:     Head: Normocephalic and atraumatic.     Mouth/Throat:     Pharynx: Oropharynx is clear.  Eyes:     Extraocular Movements: Extraocular movements intact.     Conjunctiva/sclera: Conjunctivae normal.     Pupils: Pupils are equal, round, and reactive to light.     Comments: No nystagmus  Neck:     Musculoskeletal: Neck supple.  Cardiovascular:     Rate and Rhythm: Normal rate and regular rhythm.     Pulses: Normal pulses.     Heart sounds: Normal heart sounds. No murmur.  Pulmonary:     Effort: Pulmonary effort is normal. No respiratory distress.     Breath sounds: Normal breath sounds.  Abdominal:     Palpations: Abdomen is soft.     Tenderness: There is no abdominal tenderness.  Musculoskeletal: Normal range of motion.  Skin:    General: Skin is warm and dry.  Neurological:     General: No focal deficit present.     Mental Status: She is alert.     Cranial Nerves: No cranial nerve deficit.     Sensory: No sensory deficit.      Motor: No weakness.     Coordination: Coordination normal.     Gait: Gait normal.  Psychiatric:        Mood and Affect: Mood normal.      UC Treatments / Results  Labs (all labs ordered are listed, but only abnormal results are displayed) Labs Reviewed  NOVEL CORONAVIRUS, NAA (HOSP ORDER, SEND-OUT TO REF LAB; TAT 18-24 HRS)  GLUCOSE, CAPILLARY  CBC  COMPREHENSIVE METABOLIC PANEL  POC URINE PREG, ED  POCT PREGNANCY, URINE  CBG MONITORING, ED    EKG   Radiology No results found.  Procedures Procedures (including critical care time)  Medications Ordered in UC Medications - No data to display  Initial Impression / Assessment and Plan / UC Course  I have reviewed the triage vital signs and the nursing notes.  Pertinent labs & imaging results that were available during my care of the patient were reviewed by me and considered in my medical decision making (see chart for details).    Lightheadedness-33 year old female who presents with off-and-on lightheadedness over the past 3 to 4 days. She has had some associated nausea with this. Work-up here today to include pregnancy test, EKG, glucose, CBC, CMP and Covid EKG with sinus bradycardia otherwise normal EKG. CBG 82 CMP and CBC still pending. Most likely some sort of viral illness, ruling out Covid Recommend go home rest, stay hydrated and monitor symptoms. We will call with any abnormal or concerning results Patient understanding and agreed Return precautions given   Final Clinical Impressions(s) / UC Diagnoses   Final diagnoses:  Lightheadedness     Discharge Instructions     Your EKG was normal.  Your blood sugar is normal Your pregnancy test was negative We drew some more blood work and we will call you with any abnormal results this evening. This could be some sort of viral illness.  We will call you if your Covid results are positive.  I would stay home and rest and take precautions Drink plenty of  fluids If the symptoms become worse you might need to go to the ER.    ED Prescriptions    None     PDMP not reviewed this encounter.   Janace Aris, NP 08/12/19 1612

## 2019-08-12 NOTE — ED Triage Notes (Signed)
Pt states she has been having dizziness x 4 days. Pt states she needs a work note. Pt states she has not tried any medicine for the dizziness.

## 2019-08-13 LAB — NOVEL CORONAVIRUS, NAA (HOSP ORDER, SEND-OUT TO REF LAB; TAT 18-24 HRS): SARS-CoV-2, NAA: DETECTED — AB

## 2019-08-14 ENCOUNTER — Telehealth: Payer: Self-pay | Admitting: Emergency Medicine

## 2019-08-14 NOTE — Telephone Encounter (Signed)
Positive Covid detected on sample. Contacted patient and made her aware. Quarantine ends on 08/19/2019. All questions answered.

## 2020-07-10 ENCOUNTER — Ambulatory Visit
Admission: EM | Admit: 2020-07-10 | Discharge: 2020-07-10 | Disposition: A | Discharge status: Home / Self Care | Payer: Self-pay | Attending: Emergency Medicine | Admitting: Emergency Medicine

## 2020-07-10 ENCOUNTER — Other Ambulatory Visit: Payer: Self-pay

## 2020-07-10 DIAGNOSIS — R519 Headache, unspecified: Secondary | ICD-10-CM

## 2020-07-10 DIAGNOSIS — K529 Noninfective gastroenteritis and colitis, unspecified: Secondary | ICD-10-CM

## 2020-07-10 MED ORDER — DEXAMETHASONE SODIUM PHOSPHATE 10 MG/ML IJ SOLN
10.0000 mg | Freq: Once | INTRAMUSCULAR | Status: AC
  Administered 2020-07-10: 10 mg via INTRAMUSCULAR

## 2020-07-10 MED ORDER — KETOROLAC TROMETHAMINE 30 MG/ML IJ SOLN
30.0000 mg | Freq: Once | INTRAMUSCULAR | Status: AC
  Administered 2020-07-10: 30 mg via INTRAMUSCULAR

## 2020-07-10 MED ORDER — ONDANSETRON 4 MG PO TBDP
4.0000 mg | ORAL_TABLET | Freq: Once | ORAL | Status: AC
  Administered 2020-07-10: 4 mg via ORAL

## 2020-07-10 MED ORDER — ONDANSETRON 4 MG PO TBDP: 4.0000 mg | ORAL_TABLET | Freq: Three times a day (TID) | ORAL | 0 refills | Status: AC | PRN

## 2020-07-10 NOTE — ED Triage Notes (Signed)
Pt states she has been having a headache and body aches x 1 week and nausea/vomiting x 2-3 days. Pt is aox4 and ambulatory.

## 2020-07-10 NOTE — Discharge Instructions (Addendum)
Your COVID test is pending - it is important to quarantine / isolate at home until your results are back. °If you test positive and would like further evaluation for persistent or worsening symptoms, you may schedule an E-visit or virtual (video) visit throughout the [facility] MyChart app or website. ° °PLEASE NOTE: If you develop severe chest pain or shortness of breath please go to the ER or call [phone] for further evaluation --> DO NOT schedule electronic or virtual visits for this. °Please call our office for further guidance / recommendations as needed. ° °For information about the Covid vaccine, please visit [email or web address] °

## 2020-07-10 NOTE — ED Provider Notes (Signed)
[facility]    CSN: [id number] Arrival date & time: 07/10/20  0900      History   Chief Complaint Chief Complaint  Patient presents with  . Headache    x 1 week  . Generalized Body Aches    x 1 week  . Emesis    x 2 days    HPI Nicole Wall is a 34 y.o. female  Presenting for weeklong course of frontal headache, myalgias.  Denies worst headache of life, visual or auditory changes, numbness, tingling, dizziness.  No chest pain, palpitations, difficulty breathing.  Also notes nausea/vomiting for the last 2-3 days.  Denies recent change in diet, lifestyle, medications, travel.  States both of her children had similar symptoms: Did not get Covid tested.  No known Covid contacts.  Denies fever, arthralgias, hematochezia, melena.  Tolerating fluid intake.  Past Medical History:  Diagnosis Date  . Asthma   . NVD (normal vaginal delivery) 05/01/2011    Patient Active Problem List   Diagnosis Date Noted  . NVD (normal vaginal delivery) 05/01/2011    Past Surgical History:  Procedure Laterality Date  . NO PAST SURGERIES      OB History    Gravida  3   Para  2   Term  2   Preterm  0   AB  1   Living  2     SAB  0   TAB  1   Ectopic  0   Multiple  0   Live Births  1            Home Medications    Prior to Admission medications   Medication Sig Start Date End Date Taking? Authorizing Provider  ondansetron (ZOFRAN ODT) 4 MG disintegrating tablet Take 1 tablet (4 mg total) by mouth every 8 (eight) hours as needed for nausea or vomiting. 07/10/20   Hall-Potvin, [place], PA-C    Family History History reviewed. No pertinent family history.  Social History Social History   Tobacco Use  . Smoking status: Current Every Day Smoker    Packs/day: 0.50    Types: Cigarettes  . Smokeless tobacco: Never Used  Vaping Use  . Vaping Use: Never used  Substance Use Topics  . Alcohol use: Yes  . Drug use: No     Allergies   Patient  has no known allergies.   Review of Systems As per HPI   Physical Exam Triage Vital Signs ED Triage Vitals [07/10/20 0919]  Enc Vitals Group     BP 109/74     Pulse Rate 67     Resp 20     Temp 98.1 F (36.7 C)     Temp Source Oral     SpO2 95 %     Weight      Height      Head Circumference      Peak Flow      Pain Score      Pain Loc      Pain Edu?      Excl. in GC?    No data found.  Updated Vital Signs BP 109/74 (BP Location: Left Arm)   Pulse 67   Temp 98.1 F (36.7 C) (Oral)   Resp 20   LMP  (LMP Unknown)   SpO2 95%   Visual Acuity Right Eye Distance:   Left Eye Distance:   Bilateral Distance:    Right Eye Near:   Left Eye Near:  Bilateral Near:     Physical Exam Constitutional:      General: She is not in acute distress.    Appearance: She is not ill-appearing or diaphoretic.  HENT:     Head: Normocephalic and atraumatic.     Mouth/Throat:     Mouth: Mucous membranes are moist.     Pharynx: Oropharynx is clear. No oropharyngeal exudate or posterior oropharyngeal erythema.  Eyes:     General: No scleral icterus.    Conjunctiva/sclera: Conjunctivae normal.     Pupils: Pupils are equal, round, and reactive to light.  Neck:     Comments: Trachea midline, negative JVD Cardiovascular:     Rate and Rhythm: Normal rate and regular rhythm.     Heart sounds: No murmur heard.  No gallop.   Pulmonary:     Effort: Pulmonary effort is normal. No respiratory distress.     Breath sounds: No wheezing, rhonchi or rales.  Musculoskeletal:     Cervical back: Neck supple. No tenderness.  Lymphadenopathy:     Cervical: No cervical adenopathy.  Skin:    Capillary Refill: Capillary refill takes less than 2 seconds.     Coloration: Skin is not jaundiced or pale.     Findings: No rash.  Neurological:     General: No focal deficit present.     Mental Status: She is alert and oriented to person, place, and time.      UC Treatments / Results   Labs (all labs ordered are listed, but only abnormal results are displayed) Labs Reviewed  NOVEL CORONAVIRUS, NAA    EKG   Radiology No results found.  Procedures Procedures (including critical care time)  Medications Ordered in UC Medications  ketorolac (TORADOL) 30 MG/ML injection 30 mg (has no administration in time range)  dexamethasone (DECADRON) injection 10 mg (has no administration in time range)  ondansetron (ZOFRAN-ODT) disintegrating tablet 4 mg (has no administration in time range)    Initial Impression / Assessment and Plan / UC Course  I have reviewed the triage vital signs and the nursing notes.  Pertinent labs & imaging results that were available during my care of the patient were reviewed by me and considered in my medical decision making (see chart for details).     Patient afebrile, nontoxic, with SpO2 92%.  Appears well-hydrated at this time.  No neurocognitive deficits: Headache cocktail given in office which he tolerated well.  Covid PCR pending.  Patient to quarantine until results are back.  We will treat supportively as outlined below.  Return precautions discussed, patient verbalized understanding and is agreeable to plan. Final Clinical Impressions(s) / UC Diagnoses   Final diagnoses:  Frontal headache  Gastroenteritis     Discharge Instructions     Your COVID test is pending - it is important to quarantine / isolate at home until your results are back. If you test positive and would like further evaluation for persistent or worsening symptoms, you may schedule an E-visit or virtual (video) visit throughout the [facility] app or website.  PLEASE NOTE: If you develop severe chest pain or shortness of breath please go to the ER or call [phone] for further evaluation --> DO NOT schedule electronic or virtual visits for this. Please call our office for further guidance / recommendations as needed.  For information about the Covid vaccine,  please visit [email or web address]    ED Prescriptions    Medication Sig Dispense Auth. Provider   ondansetron (ZOFRAN ODT) 4 MG  disintegrating tablet Take 1 tablet (4 mg total) by mouth every 8 (eight) hours as needed for nausea or vomiting. 21 tablet Hall-Potvin, [place], PA-C     PDMP not reviewed this encounter.   Hall-Potvin, [place], [place] 07/10/20 1026

## 2020-07-11 LAB — NOVEL CORONAVIRUS, NAA: SARS-CoV-2, NAA: NOT DETECTED

## 2020-07-11 LAB — SARS-COV-2, NAA 2 DAY TAT
